# Patient Record
Sex: Male | Born: 1950 | Race: White | Hispanic: No | Marital: Single | Smoking: Never smoker
Health system: Southern US, Community
[De-identification: ages and names within clinical notes are randomized; demographics above are authoritative.]

## PROBLEM LIST (undated history)

## (undated) DIAGNOSIS — R079 Chest pain, unspecified: Secondary | ICD-10-CM

## (undated) DIAGNOSIS — I10 Essential (primary) hypertension: Secondary | ICD-10-CM

## (undated) HISTORY — PX: APPENDECTOMY: SHX54

## (undated) HISTORY — DX: Chest pain, unspecified: R07.9

## (undated) HISTORY — PX: FRACTURE SURGERY: SHX138

## (undated) HISTORY — DX: Essential (primary) hypertension: I10

## (undated) HISTORY — PX: CHOLECYSTECTOMY: SHX55

---

## 2013-12-10 ENCOUNTER — Emergency Department (HOSPITAL_COMMUNITY)
Admission: EM | Admit: 2013-12-10 | Discharge: 2013-12-10 | Disposition: A | Discharge status: Home / Self Care | Payer: BC Managed Care – PPO | Attending: Emergency Medicine | Admitting: Emergency Medicine

## 2013-12-10 ENCOUNTER — Encounter (HOSPITAL_COMMUNITY): Payer: Self-pay | Admitting: Emergency Medicine

## 2013-12-10 ENCOUNTER — Emergency Department (HOSPITAL_COMMUNITY): Payer: BC Managed Care – PPO

## 2013-12-10 DIAGNOSIS — R0789 Other chest pain: Secondary | ICD-10-CM | POA: Insufficient documentation

## 2013-12-10 LAB — BASIC METABOLIC PANEL
BUN: 25 mg/dL — ABNORMAL HIGH (ref 6–23)
CHLORIDE: 100 meq/L (ref 96–112)
CO2: 25 meq/L (ref 19–32)
Calcium: 9.7 mg/dL (ref 8.4–10.5)
Creatinine, Ser: 0.82 mg/dL (ref 0.50–1.35)
GFR calc non Af Amer: >90 mL/min (ref >90)
Glucose, Bld: 109 mg/dL — ABNORMAL HIGH (ref 70–99)
Potassium: 4.3 mEq/L (ref 3.7–5.3)
Sodium: 137 mEq/L (ref 137–147)

## 2013-12-10 LAB — TROPONIN I: Troponin I: <0.3 ng/mL (ref <0.30)

## 2013-12-10 LAB — CBC
HCT: 41.2 % (ref 39.0–52.0)
Hemoglobin: 14.3 g/dL (ref 13.0–17.0)
MCH: 29.5 pg (ref 26.0–34.0)
MCHC: 34.7 g/dL (ref 30.0–36.0)
MCV: 85.1 fL (ref 78.0–100.0)
PLATELETS: 210 10*3/uL (ref 150–400)
RBC: 4.84 MIL/uL (ref 4.22–5.81)
RDW: 14.6 % (ref 11.5–15.5)
WBC: 8.4 10*3/uL (ref 4.0–10.5)

## 2013-12-10 LAB — POCT I-STAT TROPONIN I: TROPONIN I, POC: 0.01 ng/mL (ref 0.00–0.08)

## 2013-12-10 MED ORDER — ASPIRIN 81 MG PO CHEW
243.0000 mg | CHEWABLE_TABLET | Freq: Once | ORAL | Status: AC
  Administered 2013-12-10: 243 mg via ORAL
  Filled 2013-12-10: qty 3

## 2013-12-10 MED ORDER — NITROGLYCERIN 0.4 MG SL SUBL
0.4000 mg | SUBLINGUAL_TABLET | SUBLINGUAL | Status: DC | PRN
  Administered 2013-12-10 (×2): 0.4 mg via SUBLINGUAL
  Filled 2013-12-10: qty 25

## 2013-12-10 NOTE — Progress Notes (Addendum)
   CARE MANAGEMENT ED NOTE 12/10/2013  Patient:  Meribeth MattesUCIFORE,Jeb   Account Number:  [id number]  Date Initiated:  12/10/2013  Documentation initiated by:  Edd ArbourGIBBS,KIMBERLY  Subjective/Objective Assessment:   63 yr old bcbs [place] ppo pt Reports moving from [place] to [place] but pcp remains in [place] Not sure if he plans to stay in [place] PCP is Dr Sharia Reevehernoff     Subjective/Objective Assessment Detail:     Action/Plan:   EPIC updated   Action/Plan Detail:   Anticipated DC Date:  12/10/2013     Status Recommendation to Physician:   Result of Recommendation:    Other ED Services  Consult Working Plan    DC Planning Services  Other  PCP issues  Outpatient Services - Pt will follow up    Choice offered to / List presented to:            Status of service:  Completed, signed off  ED Comments:   ED Comments Detail:   [facility] ED CM spoke with pt on how to obtain an in network pcp with insurance coverage via the customer service number or web site  Cm reviewed ED level of care for crisis/emergent services and community pcp level of care to manage continuous or chronic medical concerns.  The pt voiced understanding CM encouraged pt and discussed pt's responsibility to verify with pt's insurance carrier that any recommended medical provider offered by any emergency room or a hospital provider is within the carrier's network. The pt voiced understanding

## 2013-12-10 NOTE — ED Notes (Signed)
Pt ambulated to nearby restroom independently to void.

## 2013-12-10 NOTE — ED Notes (Addendum)
Pt reports "a little bit of chest pain" 2/10 pain. EDP Zavitz made aware.

## 2013-12-10 NOTE — ED Notes (Signed)
Pt reports treated for flu over a month ago. Was seen yesterday and diagnosed with sinus infection. Pt reports new central chest pain for an hour. Pt NAD.

## 2013-12-10 NOTE — ED Notes (Signed)
Pt denies chest pain at present time. Pt states will alert staff if chest pain returns.

## 2013-12-10 NOTE — ED Notes (Signed)
Pt alert, arrives from work, c/o mid sternal non radiating chest pain, describes as "tight fist", denies n/v, sob with pain, skin pwd, resp even unlabored

## 2013-12-10 NOTE — ED Provider Notes (Signed)
CSN: [id number]     Arrival date & time 12/10/13  0829 History   First MD Initiated Contact with Patient 12/10/13 0831     Chief Complaint  Patient presents with  . Chest Pain    Onset was this am, recent flu, sinus infection   (Consider location/radiation/quality/duration/timing/severity/associated sxs/prior Treatment) HPI Comments: 63 yo male with GB/ appendix removal hx, recent sinusitis on amoxicillin presents with central chest tightness for 1 hr PTA, pt has mild tightness currently, had baby asa this am.  No hx of similar, no cardiac or PE hx or risk factors.  Mother died of PE after birth control.  No FH cardiac.  No smoking or drugs.  No recent exertional sxs pt walks up stairs every day without sxs.  Patient denies blood clot history, active cancer, recent major trauma or surgery, unilateral leg swelling/ pain, recent long travel, or hemoptysis. No htn, chol, dm or cad. No recent stress test. Mild radiation to jaw.   Patient is a 63 y.o. male presenting with chest pain. The history is provided by the patient.  Chest Pain Associated symptoms: no abdominal pain, no back pain, no diaphoresis, no fever, no headache, no shortness of breath and not vomiting     History reviewed. No pertinent past medical history. Past Surgical History  Procedure Laterality Date  . Appendectomy    . Cholecystectomy    . Fracture surgery     History reviewed. No pertinent family history. History  Substance Use Topics  . Smoking status: Never Smoker   . Smokeless tobacco: Not on file  . Alcohol Use: No    Review of Systems  Constitutional: Negative for fever, chills and diaphoresis.  HENT: Negative for congestion.   Eyes: Negative for visual disturbance.  Respiratory: Negative for shortness of breath.   Cardiovascular: Positive for chest pain.  Gastrointestinal: Negative for vomiting and abdominal pain.  Genitourinary: Negative for dysuria and flank pain.  Musculoskeletal: Negative for back  pain, neck pain and neck stiffness.  Skin: Negative for rash.  Neurological: Negative for light-headedness and headaches.    Allergies  Sulfa antibiotics  Home Medications  No current outpatient prescriptions on file. BP 170/96  Pulse 77  Temp(Src) 98 F (36.7 C) (Oral)  Resp 16  Wt 174 lb (78.926 kg)  SpO2 96% Physical Exam  Nursing note and vitals reviewed. Constitutional: He is oriented to person, place, and time. He appears well-developed and well-nourished.  HENT:  Head: Normocephalic and atraumatic.  Eyes: Conjunctivae are normal. Right eye exhibits no discharge. Left eye exhibits no discharge.  Neck: Normal range of motion. Neck supple. No tracheal deviation present.  Cardiovascular: Normal rate, regular rhythm and intact distal pulses.   No murmur heard. Pulmonary/Chest: Effort normal and breath sounds normal.  Abdominal: Soft. He exhibits no distension. There is no tenderness. There is no guarding.  Musculoskeletal: He exhibits no edema and no tenderness.  Neurological: He is alert and oriented to person, place, and time.  Skin: Skin is warm. No rash noted.  Psychiatric: He has a normal mood and affect.    ED Course  Procedures (including critical care time) Labs Review Labs Reviewed  BASIC METABOLIC PANEL - Abnormal; Notable for the following:    Glucose, Bld 109 (*)    BUN 25 (*)    All other components within normal limits  CBC  TROPONIN I  TROPONIN I  POCT I-STAT TROPONIN I   Imaging Review Dg Chest 2 View  12/10/2013  CLINICAL DATA:  Chest pain  EXAM: CHEST  2 VIEW  COMPARISON:  None.  FINDINGS: The heart size and mediastinal contours are within normal limits. Both lungs are clear. The visualized skeletal structures are unremarkable.  IMPRESSION: No active cardiopulmonary disease.   Electronically Signed   By: Marlan Palau M.D.   On: 12/10/2013 09:37    EKG Interpretation    Date/Time:  Tuesday December 10 2013 08:34:39 EST Ventricular Rate:   85 PR Interval:  158 QRS Duration: 102 QT Interval:  414 QTC Calculation: 492 R Axis:   29 Text Interpretation:  Sinus rhythm Borderline prolonged QT interval Baseline wander in lead(s) V2 Confirmed by Emmanual Gauthreaux  MD, Mischa Pollard ([id number]) on 12/10/2013 8:42:43 AM            MDM   1. Chest tightness     No concern for PE at this time, no risk factors, no sob, normal hr.  Low risk CAD - plan for delta troponin, asa, CXR, repeat ekg and either close outpt fup for stress or observation in hospital pending sxs/ results CXR no acute findings, reviewed. No tearing sensation, equal pulses, no concern for dissection.  Well appearing in ED.   Patient has capacity to make decisions, understands benefits of hospitalization and risks of going home versus observation and that his symptoms may be cardiac related.  Patient prefers close outpt fup. Patient understands they may return at any time.   REpeat EKG no acute findings.  ASA in ED.  Rechecked, no pain. Discussed stress test/ close fup outpt, pt will return for any concerns.  Results and differential diagnosis were discussed with the patient. Close follow up outpatient was discussed, patient comfortable with the plan.          Enid Skeens, MD 12/10/13 [phone]

## 2013-12-10 NOTE — Discharge Instructions (Signed)
Take aspirin daily until you see a primary doctor for stress test. Return to ER for any worsening symptoms or if you prefer to be observed in the hospital.  If you were given medicines take as directed.  If you are on coumadin or contraceptives realize their levels and effectiveness is altered by many different medicines.  If you have any reaction (rash, tongues swelling, other) to the medicines stop taking and see a physician.   Please follow up as directed and return to the ER or see a physician for new or worsening symptoms.  Thank you.  Chest Pain (Nonspecific) Chest pain has many causes. Your pain could be caused by something serious, such as a heart attack or a blood clot in the lungs. It could also be caused by something less serious, such as a chest bruise or a virus. Follow up with your doctor. More lab tests or other studies may be needed to find the cause of your pain. Most of the time, nonspecific chest pain will improve within 2 to 3 days of rest and mild pain medicine. HOME CARE  For chest bruises, you may put ice on the sore area for 15-20 minutes, 03-04 times a day. Do this only if it makes you feel better.  Put ice in a plastic bag.  Place a towel between the skin and the bag.  Rest for the next 2 to 3 days.  Go back to work if the pain improves.  See your doctor if the pain lasts longer than 1 to 2 weeks.  Only take medicine as told by your doctor.  Quit smoking if you smoke. GET HELP RIGHT AWAY IF:   There is more pain or pain that spreads to the arm, neck, jaw, back, or belly (abdomen).  You have shortness of breath.  You cough more than usual or cough up blood.  You have very bad back or belly pain, feel sick to your stomach (nauseous), or throw up (vomit).  You have very bad weakness.  You pass out (faint).  You have a fever. Any of these problems may be serious and may be an emergency. Do not wait to see if the problems will go away. Get medical help  right away. Call your local emergency services 911 in [place] Do not drive yourself to the hospital. MAKE SURE YOU:   Understand these instructions.  Will watch this condition.  Will get help right away if you or your child is not doing well or gets worse. Document Released: 04/11/2008 Document Revised: 01/16/2012 Document Reviewed: 04/11/2008 [facility] Patient Information 2014 [employer] [place].

## 2013-12-11 ENCOUNTER — Encounter: Payer: Self-pay | Admitting: Cardiology

## 2013-12-11 ENCOUNTER — Ambulatory Visit (INDEPENDENT_AMBULATORY_CARE_PROVIDER_SITE_OTHER): Payer: BC Managed Care – PPO | Admitting: Cardiology

## 2013-12-11 VITALS — BP 154/100 | HR 69 | Ht 69.0 in | Wt 174.0 lb

## 2013-12-11 DIAGNOSIS — R079 Chest pain, unspecified: Secondary | ICD-10-CM

## 2013-12-11 DIAGNOSIS — I10 Essential (primary) hypertension: Secondary | ICD-10-CM

## 2013-12-11 HISTORY — DX: Essential (primary) hypertension: I10

## 2013-12-11 HISTORY — DX: Chest pain, unspecified: R07.9

## 2013-12-11 MED ORDER — NITROGLYCERIN 0.4 MG SL SUBL: 0.4000 mg | SUBLINGUAL_TABLET | SUBLINGUAL | Status: DC | PRN

## 2013-12-11 MED ORDER — AMLODIPINE BESYLATE 2.5 MG PO TABS: 2.5000 mg | ORAL_TABLET | Freq: Every day | ORAL | Status: DC

## 2013-12-11 MED ORDER — NITROGLYCERIN 0.4 MG SL SUBL: 0.4000 mg | SUBLINGUAL_TABLET | SUBLINGUAL | Status: AC | PRN

## 2013-12-11 NOTE — Progress Notes (Signed)
Patient ID: Kevyn Boquet, male   DOB: 06/24/1951, 63 y.o.   MRN: [id number]     Patient Name: Gregory Ramirez Date of Encounter: 12/11/2013  Primary Care Provider:  Provider Not In System Primary Cardiologist:  Tobias Alexander, H  Problem List   History reviewed. No pertinent past medical history. Past Surgical History  Procedure Laterality Date  . Appendectomy    . Cholecystectomy    . Fracture surgery      Allergies  Allergies  Allergen Reactions  . Sulfa Antibiotics Rash    HPI  63 yo male with no significant prior medical history who is coming for concern of chest pain. The patient has recently moved here from [place] and doesn't have establish medical care in the area. The patient was at work yesterday speaking with his colleague when he developed sudden onset of retrosternal chest pain radiating to his jaw. The patient felt dizzy and short of breath at that time. He was taken to the ER where sublingual nitroglycerin helped to resolve pain within 5 minutes. The pain returned in about an hour and another sublingual nitroglycerin resolved the pain again. The patient denies prior medical history of hypertension, hyperlipidemia, or family history of coronary artery disease. His father had stroke. Mother died of PE after birth control. He has never smoked. The patient is physically active walking a lot at work and asymptomatic.  Home Medications  Prior to Admission medications   Medication Sig Start Date End Date Taking? Authorizing Provider  amoxicillin (AMOXIL) 500 MG capsule Take 500 mg by mouth 2 (two) times daily.   Yes Historical Provider, MD  aspirin EC 81 MG tablet Take 81 mg by mouth daily.   Yes Historical Provider, MD  HYDROcodone-homatropine (HYCODAN) 5-1.5 MG/5ML syrup Take 5 mLs by mouth every 4 (four) hours as needed for cough.   Yes Historical Provider, MD    Family History  Family History  Problem Relation Age of Onset  . Clotting disorder Mother   .  Hypertension Father   . Stroke Father   . COPD Father   . Cancer Brother     Social History  History   Social History  . Marital Status: Single    Spouse Name: N/A    Number of Children: N/A  . Years of Education: N/A   Occupational History  . Not on file.   Social History Main Topics  . Smoking status: Never Smoker   . Smokeless tobacco: Not on file  . Alcohol Use: No  . Drug Use: Not on file  . Sexual Activity: Not on file   Other Topics Concern  . Not on file   Social History Narrative  . No narrative on file     Review of Systems, as per HPI, otherwise negative General:  No chills, fever, night sweats or weight changes.  Cardiovascular:  No chest pain, dyspnea on exertion, edema, orthopnea, palpitations, paroxysmal nocturnal dyspnea. Dermatological: No rash, lesions/masses Respiratory: No cough, dyspnea Urologic: No hematuria, dysuria Abdominal:   No nausea, vomiting, diarrhea, bright red blood per rectum, melena, or hematemesis Neurologic:  No visual changes, wkns, changes in mental status. All other systems reviewed and are otherwise negative except as noted above.  Physical Exam  Blood pressure 154/100, pulse 69, height 5\' 9"  (1.753 m), weight 174 lb (78.926 kg).  General: Pleasant, NAD Psych: Normal affect. Neuro: Alert and oriented X 3. Moves all extremities spontaneously. HEENT: Normal  Neck: Supple without bruits or JVD. Lungs:  Resp regular and unlabored, CTA. Heart: RRR no s3, s4, or murmurs. Abdomen: Soft, non-tender, non-distended, BS + x 4.  Extremities: No clubbing, cyanosis or edema. DP/PT/Radials 2+ and equal bilaterally.  Labs:   Recent Labs  12/10/13 0832 12/10/13 1250  TROPONINI <0.30 <0.30   Lab Results  Component Value Date   WBC 8.4 12/10/2013   HGB 14.3 12/10/2013   HCT 41.2 12/10/2013   MCV 85.1 12/10/2013   PLT 210 12/10/2013    Recent Labs Lab 12/10/13 0832  NA 137  K 4.3  CL 100  CO2 25  BUN 25*  CREATININE 0.82    CALCIUM 9.7  GLUCOSE 109*   Accessory Clinical Findings  Echocardiogram - none  ECG - Sinus rhythm,  Normal EKG.   Assessment & Plan  A pleasant 63 year old male  1. Chest pain - typical in character and the fact that nitroglycerin resolved it makes it highly suspicious for possible coronary artery disease, however patient fairly active and never developed chest pain during exertion. We had a long discussion about stress test versus cardiac catheterization. Considering patient's only risk factor is high blood pressure and his EKG is completely normal and this is his first episode of chest pain will proceed with exercise nuclear stress test. Meanwhile we will prescribe patient sublingual nitroglycerin when necessary.  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  Followup in 2 weeks.   Lars MassonNELSON, Mirriam Vadala, H, MD, [facility] 12/11/2013, 2:39 PM

## 2013-12-11 NOTE — Patient Instructions (Signed)
Your physician has recommended you make the following change in your medication: start taking Nitroglycerin 0.4 mg as needed for chest pain. Please follow instructions given at todays visit and on medication bottle.  Your physician has requested that you have an exercise stress myoview. For further information please visit [email or web address]. Please follow instruction sheet, as given. Please schedule to be done early next week per Dr Delton SeeNelson.  Your physician recommends that you schedule a follow-up appointment in: 2 weeks

## 2013-12-25 ENCOUNTER — Encounter (HOSPITAL_COMMUNITY): Payer: BC Managed Care – PPO

## 2013-12-25 ENCOUNTER — Ambulatory Visit (HOSPITAL_COMMUNITY): Payer: BC Managed Care – PPO | Attending: Cardiology | Admitting: Radiology

## 2013-12-25 VITALS — BP 138/90 | HR 65 | Ht 69.0 in | Wt 174.0 lb

## 2013-12-25 DIAGNOSIS — R0789 Other chest pain: Secondary | ICD-10-CM | POA: Insufficient documentation

## 2013-12-25 DIAGNOSIS — I10 Essential (primary) hypertension: Secondary | ICD-10-CM | POA: Insufficient documentation

## 2013-12-25 DIAGNOSIS — R079 Chest pain, unspecified: Secondary | ICD-10-CM

## 2013-12-25 DIAGNOSIS — R42 Dizziness and giddiness: Secondary | ICD-10-CM | POA: Insufficient documentation

## 2013-12-25 DIAGNOSIS — R0602 Shortness of breath: Secondary | ICD-10-CM | POA: Insufficient documentation

## 2013-12-25 MED ORDER — TECHNETIUM TC 99M SESTAMIBI GENERIC - CARDIOLITE
10.0000 | Freq: Once | INTRAVENOUS | Status: AC | PRN
  Administered 2013-12-25: 10 via INTRAVENOUS

## 2013-12-25 MED ORDER — TECHNETIUM TC 99M SESTAMIBI GENERIC - CARDIOLITE
30.0000 | Freq: Once | INTRAVENOUS | Status: AC | PRN
  Administered 2013-12-25: 30 via INTRAVENOUS

## 2013-12-25 NOTE — Progress Notes (Signed)
[facility] S[facility] NUCLEAR MED [address] [place], [place] [address] [phone]    Cardiology Nuclear Med Study  Meribeth MattesSamuel Ramirez is a 63 y.o. male     MRN : [id number]     DOB: 06/24/1951  Procedure Date: 12/25/2013  Nuclear Med Background Indication for Stress Test:  Evaluation for Ischemia and Post Hospital History:  no known hx of CAD or prior cardiac testing Cardiac Risk Factors: Hypertension  Symptoms:  Chest Pain (last date of chest discomfort 12/10/13) with radiation to jaw; associated dizziness, SOB   Nuclear Pre-Procedure Caffeine/Decaff Intake:  None NPO After: 5:30pm   Lungs:  clear O2 Sat: 98% on room air. IV 0.9% NS with Angio Cath:  20g  IV Site: R Hand  IV Started by:  Doyne Keelonya Yount, CNMT  Chest Size (in):  38 Cup Size: n/a  Height: 5\' 9"  (1.753 m)  Weight:  174 lb (78.926 kg)  BMI:  Body mass index is 25.68 kg/(m^2). Tech Comments:  Took all am meds    Nuclear Med Study 1 or 2 day study: 1 day  Stress Test Type:  Stress  Reading MD: N/A  Order Authorizing Provider:  Eloy EndK. Nelson, MD  Resting Radionuclide: Technetium 3635m Sestamibi  Resting Radionuclide Dose: 11.0 mCi   Stress Radionuclide:  Technetium 3635m Sestamibi  Stress Radionuclide Dose: 33.0 mCi           Stress Protocol Rest HR: 65 Stress HR: 160  Rest BP: 138/90 Stress BP: 209/91  Exercise Time (min): 7:00 METS: 8.5           Dose of Adenosine (mg):  n/a Dose of Lexiscan: n/a mg  Dose of Atropine (mg): n/a Dose of Dobutamine: n/a mcg/kg/min (at max HR)  Stress Test Technologist: Nelson ChimesSharon Brooks, BS-ES  Nuclear Technologist:  Domenic PoliteStephen Carbone, CNMT     Rest Procedure:  Myocardial perfusion imaging was performed at rest 45 minutes following the intravenous administration of Technetium 6435m Sestamibi. Rest ECG: NSR with poor R wave progression.   Stress Procedure:  The patient exercised on the treadmill utilizing the Bruce Protocol for 7:00 minutes. The patient stopped due to fatigue and  denied any chest pain.  Technetium 5435m Sestamibi was injected at peak exercise and myocardial perfusion imaging was performed after a brief delay. Stress ECG: No significant change from baseline ECG  QPS Raw Data Images:  Normal; no motion artifact; normal heart/lung ratio. Stress Images:  Normal homogeneous uptake in all areas of the myocardium. Subtle decreased uptake inferior wall basal seen at rest and stress (diaphragmatic attenuation).  Rest Images:  Normal homogeneous uptake in all areas of the myocardium. Subtraction (SDS):  No evidence of ischemia. Transient Ischemic Dilatation (Normal <1.22):  0.90 Lung/Heart Ratio (Normal <0.45):  0.27  Quantitative Gated Spect Images QGS EDV:  131 ml QGS ESV:  68 ml  Impression Exercise Capacity:  Fair exercise capacity. BP Response:  Normal blood pressure response.  Clinical Symptoms:  No significant symptoms noted. ECG Impression:  No significant ST segment change suggestive of ischemia. Comparison with Prior Nuclear Study: No images to compare  Overall Impression:  Low risk stress nuclear study with no ischemia. .  LV Ejection Fraction: 48%.  LV Wall Motion:  Distal anteroseptal wall hypokinesis. Low normal EF.     Donato SchultzSKAINS, Gregory Walkup, MD

## 2013-12-26 ENCOUNTER — Encounter: Payer: Self-pay | Admitting: *Deleted

## 2013-12-27 ENCOUNTER — Encounter: Payer: Self-pay | Admitting: Cardiology

## 2013-12-27 ENCOUNTER — Ambulatory Visit (INDEPENDENT_AMBULATORY_CARE_PROVIDER_SITE_OTHER): Payer: BC Managed Care – PPO | Admitting: Cardiology

## 2013-12-27 VITALS — BP 130/82 | HR 70 | Ht 69.0 in | Wt 175.0 lb

## 2013-12-27 DIAGNOSIS — I519 Heart disease, unspecified: Secondary | ICD-10-CM | POA: Insufficient documentation

## 2013-12-27 DIAGNOSIS — E785 Hyperlipidemia, unspecified: Secondary | ICD-10-CM

## 2013-12-27 LAB — COMPREHENSIVE METABOLIC PANEL
ALT: 30 U/L (ref 0–53)
AST: 23 U/L (ref 0–37)
Albumin: 4.6 g/dL (ref 3.5–5.2)
Alkaline Phosphatase: 60 U/L (ref 39–117)
BUN: 19 mg/dL (ref 6–23)
CO2: 28 mEq/L (ref 19–32)
Calcium: 10.1 mg/dL (ref 8.4–10.5)
Chloride: 107 mEq/L (ref 96–112)
Creatinine, Ser: 0.9 mg/dL (ref 0.4–1.5)
GFR: 96.65 mL/min (ref >60.00)
Glucose, Bld: 87 mg/dL (ref 70–99)
Potassium: 4.1 mEq/L (ref 3.5–5.1)
Sodium: 140 mEq/L (ref 135–145)
Total Bilirubin: 1 mg/dL (ref 0.3–1.2)
Total Protein: 7.6 g/dL (ref 6.0–8.3)

## 2013-12-27 LAB — LIPID PANEL
Cholesterol: 175 mg/dL (ref 0–200)
HDL: 68 mg/dL (ref >39.00)
LDL Cholesterol: 96 mg/dL (ref 0–99)
Total CHOL/HDL Ratio: 3
Triglycerides: 57 mg/dL (ref 0.0–149.0)
VLDL: 11.4 mg/dL (ref 0.0–40.0)

## 2013-12-27 MED ORDER — AMLODIPINE BESYLATE 5 MG PO TABS: 5.0000 mg | ORAL_TABLET | Freq: Every day | ORAL | Status: AC

## 2013-12-27 NOTE — Patient Instructions (Signed)
Your physician has recommended you make the following change in your medication: increase Amlodipine to 5 mg daily  Your physician recommends that you return for lab work in: today  Your physician recommends that you schedule a follow-up appointment in: 3 months

## 2013-12-27 NOTE — Progress Notes (Signed)
Patient ID: Gregory MattesSamuel Ramirez, male   DOB: 10/21/1951, 63 y.o.   MRN: [id number]    Patient Name: Gregory Ramirez Date of Encounter: 12/27/2013  Primary Care Provider:  Provider Not In System Primary Cardiologist:  Gregory AlexanderNELSON, Gregory Ramirez, H  Problem List   Past Medical History  Diagnosis Date  . Essential hypertension 12/11/2013  . Chest pain 12/11/2013   Past Surgical History  Procedure Laterality Date  . Appendectomy    . Cholecystectomy    . Fracture surgery      Allergies  Allergies  Allergen Reactions  . Sulfa Antibiotics Rash    HPI  63 yo male with no significant prior medical history who is coming for concern of chest pain. [place] and [place]'t have establish medical care in the area. The patient was at work yesterday speaking with his colleague when he developed sudden onset of retrosternal chest pain radiating to his jaw. The patient felt dizzy and short of breath at that time. He was taken to the ER where sublingual nitroglycerin helped to resolve pain within 5 minutes. The pain returned in about an hour and another sublingual nitroglycerin resolved the pain again. The patient denies prior medical history of hypertension, hyperlipidemia, or family history of coronary artery disease. His father had stroke. Mother died of PE after birth control. He has never smoked. The patient is physically active walking a lot at work and asymptomatic.  This is a 2 week follow up, he has no more episodes of chest pain., He brings his BP diary with many readings in 140-150' range.  Home Medications  Prior to Admission medications   Medication Sig Start Date End Date Taking? Authorizing Provider  amoxicillin (AMOXIL) 500 MG capsule Take 500 mg by mouth 2 (two) times daily.   Yes Historical Provider, MD  aspirin EC 81 MG tablet Take 81 mg by mouth daily.   Yes Historical Provider, MD  HYDROcodone-homatropine (HYCODAN) 5-1.5 MG/5ML syrup Take 5 mLs by  mouth every 4 (four) hours as needed for cough.   Yes Historical Provider, MD    Family History  Family History  Problem Relation Age of Onset  . Clotting disorder Mother   . Hypertension Father   . Stroke Father   . COPD Father   . Cancer Brother   . Pulmonary embolism Mother     Social History  History   Social History  . Marital Status: Single    Spouse Name: N/A    Number of Children: N/A  . Years of Education: N/A   Occupational History  . Not on file.   Social History Main Topics  . Smoking status: Never Smoker   . Smokeless tobacco: Not on file  . Alcohol Use: No  . Drug Use: Not on file  . Sexual Activity: Not on file   Other Topics Concern  . Not on file   Social History Narrative  . No narrative on file     Review of Systems, as per HPI, otherwise negative General:  No chills, fever, night sweats or weight changes.  Cardiovascular:  No chest pain, dyspnea on exertion, edema, orthopnea, palpitations, paroxysmal nocturnal dyspnea. Dermatological: No rash, lesions/masses Respiratory: No cough, dyspnea Urologic: No hematuria, dysuria Abdominal:   No nausea, vomiting, diarrhea, bright red blood per rectum, melena, or hematemesis Neurologic:  No visual changes, wkns, changes in mental status. All other systems reviewed and are otherwise negative except as noted above.  Physical Exam  Blood pressure 130/82, pulse 70, height 5\' 9"  (1.753 m), weight 175 lb (79.379 kg).  General: Pleasant, NAD Psych: Normal affect. Neuro: Alert and oriented X 3. Moves all extremities spontaneously. HEENT: Normal  Neck: Supple without bruits or JVD. Lungs:  Resp regular and unlabored, CTA. Heart: RRR no s3, s4, or murmurs. Abdomen: Soft, non-tender, non-distended, BS + x 4.  Extremities: No clubbing, cyanosis or edema. DP/PT/Radials 2+ and equal bilaterally.  Labs:  No results found for this basename: CKTOTAL, CKMB, TROPONINI,  in the last 72 hours Lab Results    Component Value Date   WBC 8.4 12/10/2013   HGB 14.3 12/10/2013   HCT 41.2 12/10/2013   MCV 85.1 12/10/2013   PLT 210 12/10/2013   Accessory Clinical Findings  Echocardiogram - none  ECG - Sinus rhythm,  Normal EKG.  Exercise nuclear stress test: 12/26/2013 Quantitative Gated Spect Images  QGS EDV: 131 ml  QGS ESV: 68 ml  Impression  Exercise Capacity: Fair exercise capacity.  BP Response: Normal blood pressure response.  Clinical Symptoms: No significant symptoms noted.  ECG Impression: No significant ST segment change suggestive of ischemia.  Comparison with Prior Nuclear Study: No images to compare  Overall Impression: Low risk stress nuclear study with no ischemia. .  LV Ejection Fraction: 48%. LV Wall Motion: Distal anteroseptal wall hypokinesis. Low normal EF.    Assessment & Plan  A pleasant 63 year old male  1. Chest pain - typical in character and the fact that nitroglycerin resolved it makes it highly suspicious for possible coronary artery disease, however patient fairly active and never developed chest pain during exertion. We had a long discussion about stress test versus cardiac catheterization.  Stress test showed no ischemia, but possible anteroseptal scar?, most probably artifact, considering normal ECG, however LVEF 48%. I will assume this is secondary to uncontrolled BP.  Hypertensive response on stress, BP 209, fair exercise capacity. We will increase amlodipine to 5 mg po daily and follow in 3 months. Encouraged to exercise. Explain type and frequency.   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 Increase amlodipine to 5 mg po daily.  3. Hyperlipidemia - we will check today with CMP.   Followup in 3  Months with BP diary.   Gregory Masson, MD, [facility] 12/27/2013, 8:35 AM

## 2014-03-27 ENCOUNTER — Ambulatory Visit: Payer: BC Managed Care – PPO | Admitting: Cardiology

## 2014-04-08 ENCOUNTER — Ambulatory Visit: Payer: BC Managed Care – PPO | Admitting: Cardiology

## 2015-01-16 IMAGING — CR DG CHEST 2V
2 series · 2 of 2 positions shown · non-contrast
Comparison: None.

CLINICAL DATA: Chest pain

EXAM:
CHEST  2 VIEW

[w chest pa]
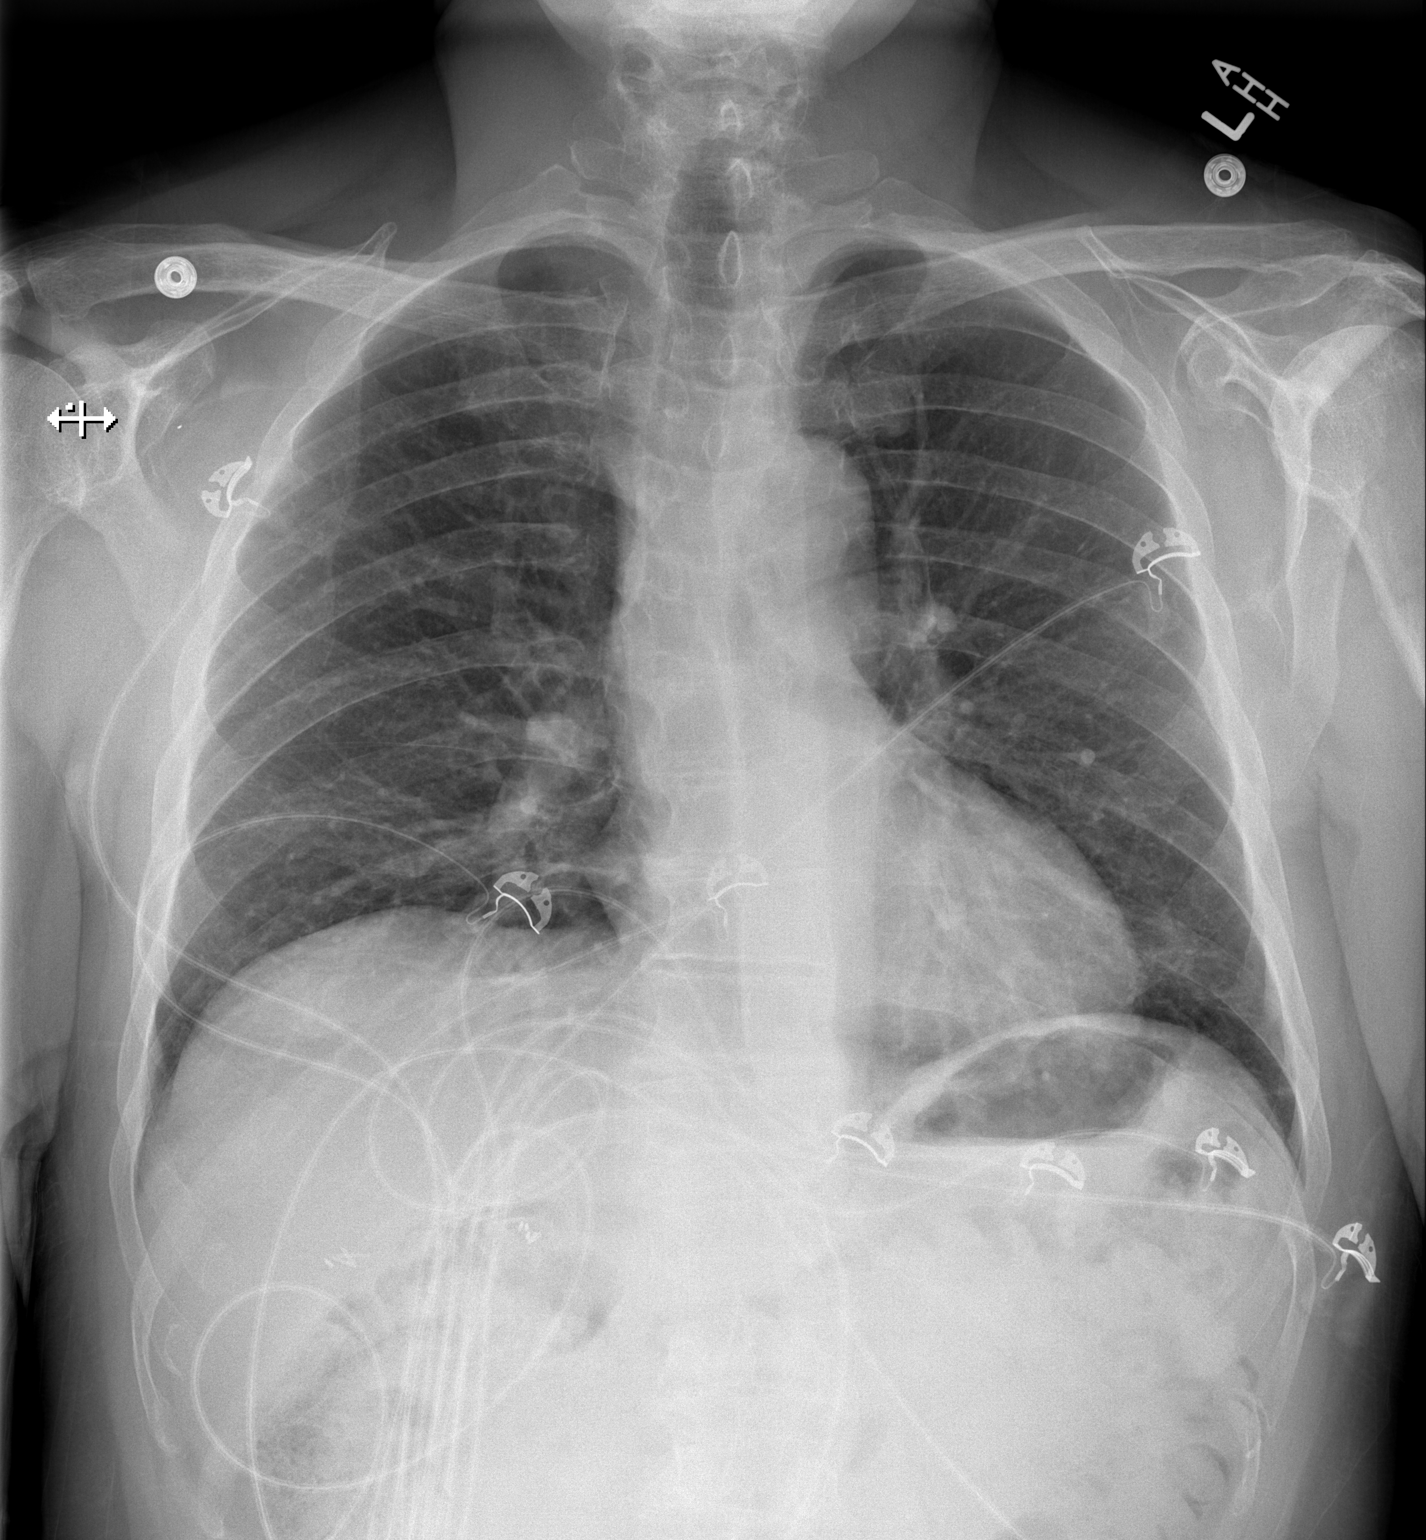

[w chest lat]
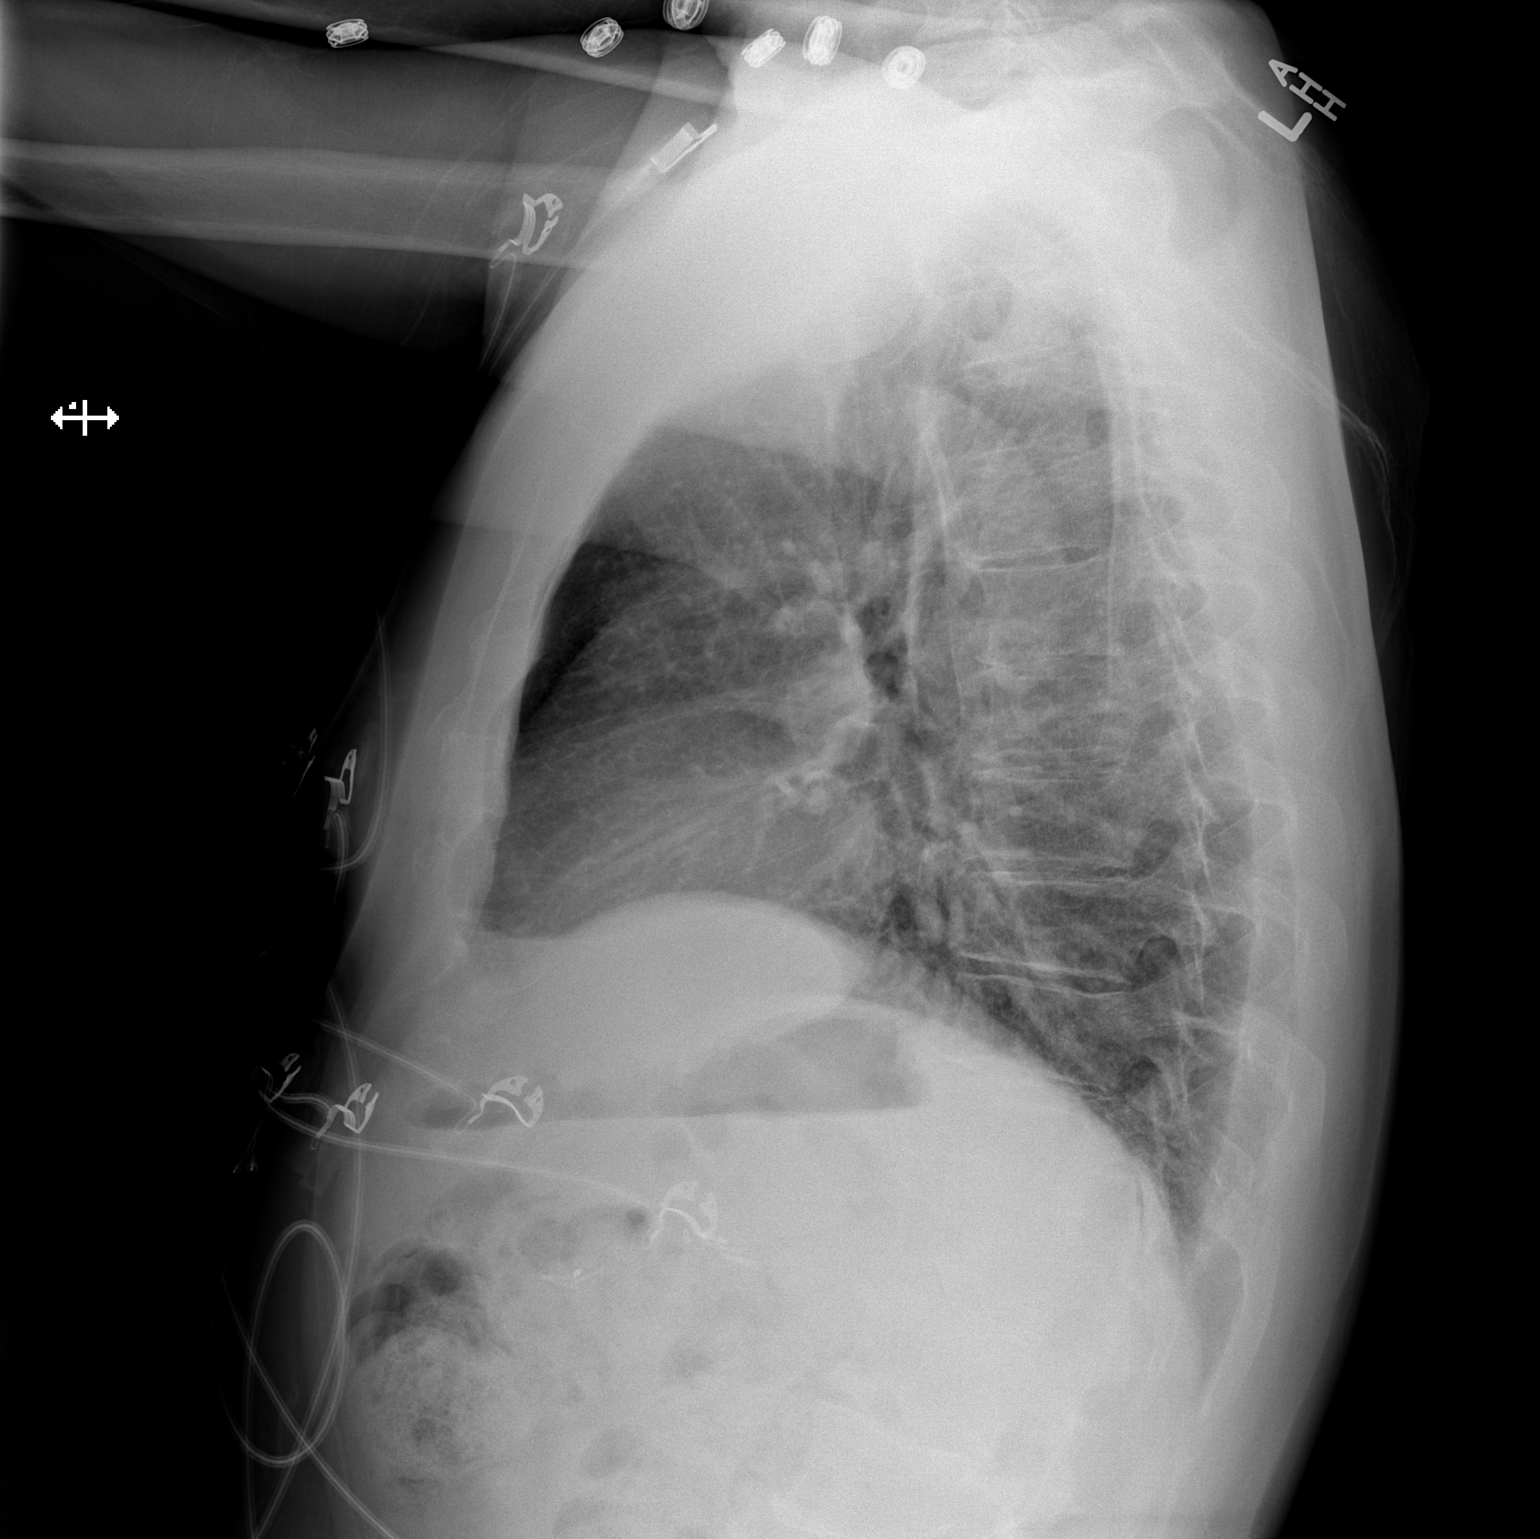

[2 of 2 positions shown; findings below may reference images not displayed]

FINDINGS: The heart size and mediastinal contours are within normal limits.
Both lungs are clear. The visualized skeletal structures are
unremarkable.
IMPRESSION: No active cardiopulmonary disease.
